# Patient Record
Sex: Male | Born: 1985 | Race: Black or African American | Hispanic: No | Marital: Single | Smoking: Never smoker
Health system: Southern US, Community
[De-identification: ages and names within clinical notes are randomized; demographics above are authoritative.]

## PROBLEM LIST (undated history)

## (undated) DIAGNOSIS — M419 Scoliosis, unspecified: Secondary | ICD-10-CM

## (undated) HISTORY — PX: OTHER SURGICAL HISTORY: SHX169

## (undated) HISTORY — DX: Scoliosis, unspecified: M41.9

---

## 2012-09-12 ENCOUNTER — Ambulatory Visit (INDEPENDENT_AMBULATORY_CARE_PROVIDER_SITE_OTHER): Payer: 59 | Admitting: Emergency Medicine

## 2012-09-12 VITALS — BP 112/78 | HR 60 | Temp 98.6°F | Resp 16 | Ht 69.5 in | Wt 180.8 lb

## 2012-09-12 DIAGNOSIS — J029 Acute pharyngitis, unspecified: Secondary | ICD-10-CM

## 2012-09-12 DIAGNOSIS — L918 Other hypertrophic disorders of the skin: Secondary | ICD-10-CM

## 2012-09-12 DIAGNOSIS — L909 Atrophic disorder of skin, unspecified: Secondary | ICD-10-CM

## 2012-09-12 MED ORDER — AZITHROMYCIN 250 MG PO TABS: ORAL_TABLET | ORAL | Status: DC

## 2012-09-12 NOTE — Progress Notes (Signed)
Procedure Note: Verbal consent obtained from the patient.  Local anesthesia with 0.5cc 1% lidocaine with epinephrine.  Skin tag removed without difficulty.  Hemostasis obtained with silver nitrate and direct pressure.  Dressing applied.  Specimen sent for pathology.  Pt tolerated the procedure well.

## 2012-09-12 NOTE — Progress Notes (Signed)
[facility] [address] [place] [address] [phone]  Date:  09/12/2012   Name:  Cameron Ramirez   DOB:  [birth year]   MRN:  [id number]  PCP:  No primary provider on file.    Chief Complaint: Nevus and Sore Throat   History of Present Illness:  Cameron Ramirez is a 27 y.o. very pleasant male patient who presents with the following:  Wears a chain and noticed a skin tag has changed in character and he is concerned as it has turned black in a portion.    Has a sore throat.  No fever or chills, nasal congestion or drainage.  Exposed to strep in a friend  There is no problem list on file for this patient.   Past Medical History  Diagnosis Date  . Scoliosis     History reviewed. No pertinent past surgical history.  History  Substance Use Topics  . Smoking status: Never Smoker   . Smokeless tobacco: Not on file  . Alcohol Use: Not on file    No family history on file.  No Known Allergies  Medication list has been reviewed and updated.  No current outpatient prescriptions on file prior to visit.    Review of Systems:  As per HPI, otherwise negative.    Physical Examination: Filed Vitals:   09/12/12 1613  BP: 112/78  Pulse: 60  Temp: 98.6 F (37 C)  Resp: 16   Filed Vitals:   09/12/12 1613  Height: 5' 9.5" (1.765 m)  Weight: 180 lb 12.8 oz (82.01 kg)   Body mass index is 26.32 kg/(m^2). Ideal Body Weight: Weight in (lb) to have BMI = 25: 171.4   GEN: WDWN, NAD, Non-toxic, A & O x 3 HEENT: Atraumatic, Normocephalic. Neck supple. No masses, No LAD. Ears and Nose: No external deformity. CV: RRR, No M/G/R. No JVD. No thrill. No extra heart sounds. PULM: CTA B, no wheezes, crackles, rhonchi. No retractions. No resp. distress. No accessory muscle use. ABD: S, NT, ND, +BS. No rebound. No HSM. EXTR: No c/c/e NEURO Normal gait.  PSYCH: Normally interactive. Conversant. Not depressed or anxious appearing.  Calm demeanor.  SKIN: left  neck has a 1.5 mm diameter skin tag.    Assessment and Plan: Strep exposure Zpak  Skin tag Amputation and send for path  Carmelina Dane, MD

## 2012-09-15 NOTE — Progress Notes (Signed)
Reviewed and agree.

## 2013-12-21 ENCOUNTER — Ambulatory Visit (INDEPENDENT_AMBULATORY_CARE_PROVIDER_SITE_OTHER): Payer: 59 | Admitting: Family Medicine

## 2013-12-21 VITALS — BP 118/72 | HR 68 | Temp 98.2°F | Resp 18 | Ht 69.0 in | Wt 186.0 lb

## 2013-12-21 DIAGNOSIS — Z113 Encounter for screening for infections with a predominantly sexual mode of transmission: Secondary | ICD-10-CM

## 2013-12-21 DIAGNOSIS — J029 Acute pharyngitis, unspecified: Secondary | ICD-10-CM

## 2013-12-21 DIAGNOSIS — Z13220 Encounter for screening for lipoid disorders: Secondary | ICD-10-CM

## 2013-12-21 DIAGNOSIS — J209 Acute bronchitis, unspecified: Secondary | ICD-10-CM

## 2013-12-21 DIAGNOSIS — Z0000 Encounter for general adult medical examination without abnormal findings: Secondary | ICD-10-CM

## 2013-12-21 LAB — POCT RAPID STREP A (OFFICE): RAPID STREP A SCREEN: POSITIVE — AB

## 2013-12-21 MED ORDER — CEFDINIR 300 MG PO CAPS: 300.0000 mg | ORAL_CAPSULE | Freq: Two times a day (BID) | ORAL | Status: DC

## 2013-12-21 NOTE — Progress Notes (Signed)
[facility] [address], [place] [id number] [phone]  Date:  12/21/2013   Name:  Cameron Ramirez   DOB:  1986/08/26   MRN:  [id number]  PCP:  No primary provider on file.    Chief Complaint: Annual Exam and Cough   History of Present Illness:  Cameron Ramirez is a 28 y.o. very pleasant male patient who presents with the following:  Here today for a CPE and also illness.  Generally healthy, non-smoker, non- drinker.  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   He does exercise- he is a Forensic psychologistprofessional dancer as well.   He is fasting today.   He would like to do STI testing as well as regular labs.   [place] and thinks he had a tetanus shot then.    He was exposed to a friend who had bronchitis a couple of weeks ago.  He notes a mild cough which can be productive and some chest congestion.  His sinuses feel "funky" and he has noted a mild ST for a couple of days.   He had a fever a couple of days ago but this has resolved.   He has been ill for a week or a little less.  No GI symptoms.   He has not urinated in over an hour.    There are no active problems to display for this patient.   Past Medical History  Diagnosis Date  . Scoliosis   . Scoliosis     History reviewed. No pertinent past surgical history.  History  Substance Use Topics  . Smoking status: Never Smoker   . Smokeless tobacco: Not on file  . Alcohol Use: No    History reviewed. No pertinent family history.  No Known Allergies  Medication list has been reviewed and updated.  Current Outpatient Prescriptions on File Prior to Visit  Medication Sig Dispense Refill  . azithromycin (ZITHROMAX) 250 MG tablet Take 2 tabs PO x 1 dose, then 1 tab PO QD x 4 days  6 tablet  0   No current facility-administered medications on file prior to visit.    Review of Systems:  As per HPI- otherwise negative.   Physical Examination: Filed Vitals:   12/21/13 1319  BP:  118/72  Pulse: 68  Temp: 98.2 F (36.8 C)  Resp: 18   Filed Vitals:   12/21/13 1319  Height: 5\' 9"  (1.753 m)  Weight: 186 lb (84.369 kg)   Body mass index is 27.45 kg/(m^2). Ideal Body Weight: Weight in (lb) to have BMI = 25: 168.9  GEN: WDWN, NAD, Non-toxic, A & O x 3, looks well, fit build HEENT: Atraumatic, Normocephalic. Neck supple. No masses, No LAD.  Bilateral TM wnl, oropharynx injected but no exudate.  PEERL,EOMI.   Ears and Nose: No external deformity. CV: RRR, No M/G/R. No JVD. No thrill. No extra heart sounds. PULM: CTA B, no wheezes, crackles, rhonchi. No retractions. No resp. distress. No accessory muscle use. ABD: S, NT, ND, +BS. No rebound. No HSM. EXTR: No c/c/e NEURO Normal gait.  PSYCH: Normally interactive. Conversant. Not depressed or anxious appearing.  Calm demeanor.  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  OW normal   Results for orders placed in visit on 12/21/13  POCT RAPID STREP A (OFFICE)      Result Value Ref Range   Rapid Strep A Screen Positive (*) Negative   Assessment and Plan:  Physical exam - Plan: CBC, Comprehensive metabolic panel  Acute pharyngitis - Plan: POCT rapid strep A, cefdinir (OMNICEF) 300 MG capsule  Routine screening for STI (sexually transmitted infection) - Plan: Hepatitis B surface antibody, Hepatitis C antibody, GC/Chlamydia Probe Amp, HIV antibody, HSV(herpes simplex vrs) 1+2 ab-IgG, Hepatitis B surface antigen, CANCELED: Hepatitis B surface antigen  Screening for hyperlipidemia - Plan: Lipid panel  Acute bronchitis - Plan: cefdinir (OMNICEF) 300 MG capsule  Treat for strep and likely bronchitis with omnicef.  Otherwise healthy young man, labs pending as above.   Will plan further follow- up pending labs.   Signed Abbe AmsterdamJessica Jonas Goh, MD

## 2013-12-21 NOTE — Patient Instructions (Signed)
We will treat your for strep throat and for bronchitis.   I will be in touch with your labs- it may take a little longer as we are using [facility] but let me know if you do not hear in about 2 weeks.   You should keep to yourself until you have been on treatment for your strep for 24 hours.  Let me know if you do not feel better soon!

## 2013-12-22 LAB — CBC
HCT: 46.9 % (ref 37.5–51.0)
HEMOGLOBIN: 16.3 g/dL (ref 12.6–17.7)
MCH: 30.5 pg (ref 26.6–33.0)
MCHC: 34.8 g/dL (ref 31.5–35.7)
MCV: 88 fL (ref 79–97)
Platelets: 247 10*3/uL (ref 150–379)
RBC: 5.34 x10E6/uL (ref 4.14–5.80)
RDW: 12 % — ABNORMAL LOW (ref 12.3–15.4)
WBC: 6.2 10*3/uL (ref 3.4–10.8)

## 2013-12-22 LAB — LIPID PANEL
CHOLESTEROL TOTAL: 148 mg/dL (ref 100–199)
Chol/HDL Ratio: 3 ratio units (ref 0.0–5.0)
HDL: 50 mg/dL (ref >39)
LDL CALC: 87 mg/dL (ref 0–99)
TRIGLYCERIDES: 55 mg/dL (ref 0–149)
VLDL CHOLESTEROL CAL: 11 mg/dL (ref 5–40)

## 2013-12-22 LAB — HIV ANTIBODY (ROUTINE TESTING W REFLEX): HIV 1/HIV 2 AB: NONREACTIVE

## 2013-12-22 LAB — HEPATITIS B SURFACE ANTIGEN: Hepatitis B Surface Ag: NEGATIVE

## 2013-12-22 LAB — COMPREHENSIVE METABOLIC PANEL
ALBUMIN: 4.6 g/dL (ref 3.5–5.5)
ALT: 12 IU/L (ref 0–44)
AST: 17 IU/L (ref 0–40)
Albumin/Globulin Ratio: 1.6 (ref 1.1–2.5)
Alkaline Phosphatase: 75 IU/L (ref 39–117)
BUN/Creatinine Ratio: 7 — ABNORMAL LOW (ref 8–19)
BUN: 7 mg/dL (ref 6–20)
CALCIUM: 9.7 mg/dL (ref 8.7–10.2)
CHLORIDE: 99 mmol/L (ref 97–108)
CO2: 29 mmol/L (ref 18–29)
Creatinine, Ser: 1 mg/dL (ref 0.76–1.27)
GFR calc non Af Amer: 103 mL/min/{1.73_m2} (ref >59)
GFR, EST AFRICAN AMERICAN: 119 mL/min/{1.73_m2} (ref >59)
GLUCOSE: 74 mg/dL (ref 65–99)
Globulin, Total: 2.9 g/dL (ref 1.5–4.5)
Potassium: 5 mmol/L (ref 3.5–5.2)
Sodium: 140 mmol/L (ref 134–144)
TOTAL PROTEIN: 7.5 g/dL (ref 6.0–8.5)
Total Bilirubin: 1.4 mg/dL — ABNORMAL HIGH (ref 0.0–1.2)

## 2013-12-22 LAB — HSV(HERPES SIMPLEX VRS) I + II AB-IGG
HAV 1 IGG,TYPE SPECIFIC AB: 47.6 index — ABNORMAL HIGH (ref 0.00–0.90)
HSV 2 IGG,TYPE SPECIFIC AB: <0.91 index (ref 0.00–0.90)

## 2013-12-22 LAB — HEPATITIS C ANTIBODY: Hep C Virus Ab: <0.1 s/co ratio (ref 0.0–0.9)

## 2013-12-22 LAB — HEPATITIS B SURFACE ANTIBODY, QUANTITATIVE: Hepatitis B Surf Ab Quant: 561.8 m[IU]/mL (ref >9.9)

## 2013-12-23 ENCOUNTER — Encounter: Payer: Self-pay | Admitting: Family Medicine

## 2013-12-25 ENCOUNTER — Encounter: Payer: Self-pay | Admitting: Family Medicine

## 2013-12-25 LAB — GC/CHLAMYDIA PROBE AMP
Chlamydia trachomatis, NAA: NEGATIVE
Neisseria gonorrhoeae by PCR: NEGATIVE

## 2014-01-04 ENCOUNTER — Encounter: Payer: Self-pay | Admitting: Family Medicine

## 2014-01-04 LAB — RPR: RPR: NONREACTIVE

## 2014-01-04 LAB — SPECIMEN STATUS REPORT

## 2014-01-22 ENCOUNTER — Ambulatory Visit (INDEPENDENT_AMBULATORY_CARE_PROVIDER_SITE_OTHER): Payer: 59 | Admitting: Internal Medicine

## 2014-01-22 VITALS — BP 126/72 | HR 83 | Temp 98.3°F | Resp 18 | Ht 69.5 in | Wt 183.0 lb

## 2014-01-22 DIAGNOSIS — R945 Abnormal results of liver function studies: Secondary | ICD-10-CM

## 2014-01-22 DIAGNOSIS — R7989 Other specified abnormal findings of blood chemistry: Secondary | ICD-10-CM

## 2014-01-22 DIAGNOSIS — Z209 Contact with and (suspected) exposure to unspecified communicable disease: Secondary | ICD-10-CM

## 2014-01-22 NOTE — Progress Notes (Signed)
   Subjective:  This chart was scribed for Dr. Ellamae Sia, MD by Leone Payor, ED Scribe. This patient was seen in room 10 and the patient's care was started 11:30 AM.    Patient ID: Cameron Ramirez, male    DOB: April 29, 1986, 28 y.o.   MRN: [id number]  HPI HPI Comments: Cameron Ramirez is a 28 y.o. male who presents to [facility] for a follow up after his visit with Dr. Dallas Schimke on 12/21/13. He was advised to follow up for elevated bilirubin. He also requests re-screening for HIV and syphilis today.  He needs a note for his work proving hepatitis B Immunity. He also has questions about abdominal cramping--has been dehydrated this week/also constipated Review of Systems No fever or chills In addition urea or discharge    Objective:   Physical Exam  Nursing note and vitals reviewed. Constitutional: He is oriented to person, place, and time. He appears well-developed and well-nourished.  HENT:  Head: Normocephalic and atraumatic.  Cardiovascular: Normal rate.   Pulmonary/Chest: Effort normal and breath sounds normal. No respiratory distress.  Abdominal: Soft. Bowel sounds are normal. He exhibits no distension and no mass. There is no tenderness. There is no rebound and no guarding.  Neurological: He is alert and oriented to person, place, and time.  Skin: Skin is warm and dry.  Psychiatric: He has a normal mood and affect.       Assessment & Plan:   I have completed the patient encounter in its entirety as documented by the scribe, with editing by me where necessary. Robert P. Merla Riches, M.D. Abnormal LFTs - Plan: RPR, HIV antibody, Hepatic function panel-sent to [employer] where he is employed  Exposure to communicable disease - Plan: RPR, HIV antibody, Hepatic function panel, CANCELED: RPR, CANCELED: HIV antibody   Notify lab results Abdominal discomfort secondary to constipation-use MiraLax and continue fluids with a high fiber diet

## 2014-01-24 LAB — HEPATIC FUNCTION PANEL
ALT: 90 IU/L — ABNORMAL HIGH (ref 0–44)
AST: 71 IU/L — AB (ref 0–40)
Albumin: 4.3 g/dL (ref 3.5–5.5)
Alkaline Phosphatase: 89 IU/L (ref 39–117)
Bilirubin, Direct: 0.36 mg/dL (ref 0.00–0.40)
TOTAL PROTEIN: 7.2 g/dL (ref 6.0–8.5)
Total Bilirubin: 1.4 mg/dL — ABNORMAL HIGH (ref 0.0–1.2)

## 2014-01-24 LAB — RPR: SYPHILIS RPR SCR: NONREACTIVE

## 2014-01-24 LAB — HIV ANTIBODY (ROUTINE TESTING W REFLEX): HIV-1/HIV-2 Ab: NONREACTIVE

## 2014-02-10 ENCOUNTER — Ambulatory Visit (INDEPENDENT_AMBULATORY_CARE_PROVIDER_SITE_OTHER): Payer: 59 | Admitting: Internal Medicine

## 2014-02-10 VITALS — BP 120/72 | HR 60 | Temp 98.1°F | Resp 16 | Ht 69.5 in | Wt 179.6 lb

## 2014-02-10 DIAGNOSIS — R945 Abnormal results of liver function studies: Secondary | ICD-10-CM

## 2014-02-10 LAB — POCT CBC
Granulocyte percent: 36.7 %G — AB (ref 37–80)
HEMATOCRIT: 46.2 % (ref 43.5–53.7)
Hemoglobin: 15.6 g/dL (ref 14.1–18.1)
LYMPH, POC: 3.2 (ref 0.6–3.4)
MCH: 30.7 pg (ref 27–31.2)
MCHC: 33.8 g/dL (ref 31.8–35.4)
MCV: 90.9 fL (ref 80–97)
MID (cbc): 0.6 (ref 0–0.9)
MPV: 9.5 fL (ref 0–99.8)
POC Granulocyte: 2.2 (ref 2–6.9)
POC LYMPH %: 53.4 % — AB (ref 10–50)
POC MID %: 9.9 %M (ref 0–12)
Platelet Count, POC: 246 10*3/uL (ref 142–424)
RBC: 5.08 M/uL (ref 4.69–6.13)
RDW, POC: 12.6 %
WBC: 5.9 10*3/uL (ref 4.6–10.2)

## 2014-02-10 LAB — POCT SEDIMENTATION RATE: POCT SED RATE: 9 mm/hr (ref 0–22)

## 2014-02-10 NOTE — Progress Notes (Signed)
Subjective:    Patient ID: Cameron Ramirez, male    DOB: Oct 08, 1985, 28 y.o.   MRN: [id number]  This chart was scribed for Ellamae Siaobert Ridley Schewe, MD by Jarvis Morganaylor Ferguson, Medical Scribe. This patient was seen in Room 1 and the patient's care was started at 11:36 AM.    Chief Complaint  Patient presents with  . Follow-up    regarding elevated Liver enzymes    HPI HPI Comments: Cameron Ramirez is a 28 y.o. male who [facility] follow up from his appt on 5//25/15 where he had elevated liver enzymes. Patient states that he has been feeling great and would not know of any problem if not for the blood tests. Patient states that is having some mild heartburn noted after eating certain foods. He states this comes mostly from fried foods and certain sauces. 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 He states that his mother has a history of elevated triglyceride levels but he states she does not have hyperlipidemia. He also states that both his grandmothers suffered from strokes and MIs.  Patient denies any weight loss, diaphoresis, fevers, or digestive issues.  see recent labs There are no active problems to display for this patient.    Review of Systems  Constitutional: Negative for fever, diaphoresis, appetite change, fatigue and unexpected weight change.  Gastrointestinal: Negative for vomiting, abdominal pain and diarrhea.       Positive for mild heartburn after eating certain foods  Genitourinary: Negative for difficulty urinating.  All other systems reviewed and are negative.      Objective:   Physical Exam  Nursing note and vitals reviewed. Constitutional: He is oriented to person, place, and time. He appears well-developed and well-nourished. No distress.  HENT:  Head: Normocephalic and atraumatic.  Eyes: Conjunctivae and EOM are normal.  Neck: Normal range of motion. No tracheal  deviation present.  Cardiovascular: Normal rate.   Pulmonary/Chest: Effort normal. No respiratory distress.  Abdominal: Soft. He exhibits no mass. There is no tenderness.  Liver is not enlarged or tender. No abdominal masses  Musculoskeletal: Normal range of motion.  Neurological: He is alert and oriented to person, place, and time.  Skin: Skin is warm and dry.  Psychiatric: He has a normal mood and affect. His behavior is normal.        Assessment & Plan:  Abnormal results of liver function studies - Plan: POCT CBC, POCT SEDIMENTATION RATE, Hepatic Function Panel,   Results for orders placed in visit on 02/10/14  POCT CBC      Result Value Ref Range   WBC 5.9  4.6 - 10.2 K/uL   Lymph, poc 3.2  0.6 - 3.4   POC LYMPH PERCENT 53.4 (*) 10 - 50 %L   MID (cbc) 0.6  0 - 0.9   POC MID % 9.9  0 - 12 %M   POC Granulocyte 2.2  2 - 6.9   Granulocyte percent 36.7 (*) 37 - 80 %G   RBC 5.08  4.69 - 6.13 M/uL   Hemoglobin 15.6  14.1 - 18.1 g/dL   HCT, POC 82.946.2  56.243.5 - 53.7 %   MCV 90.9  80 - 97 fL   MCH, POC 30.7  27 - 31.2 pg   MCHC 33.8  31.8 - 35.4 g/dL   RDW, POC 13.012.6     Platelet Count, POC 246  142 -  424 K/uL   MPV 9.5  0 - 99.8 fL  POCT SEDIMENTATION RATE      Result Value Ref Range   POCT SED RATE 9  0 - 22 mm/hr   Awaiting repeat liver studies/may need to add EBV serology

## 2014-02-11 DIAGNOSIS — R945 Abnormal results of liver function studies: Secondary | ICD-10-CM | POA: Insufficient documentation

## 2014-02-13 LAB — HEPATIC FUNCTION PANEL
ALK PHOS: 89 IU/L (ref 39–117)
ALT: 341 IU/L — ABNORMAL HIGH (ref 0–44)
AST: 117 IU/L — ABNORMAL HIGH (ref 0–40)
Albumin: 4.2 g/dL (ref 3.5–5.5)
BILIRUBIN DIRECT: 0.33 mg/dL (ref 0.00–0.40)
TOTAL PROTEIN: 7.1 g/dL (ref 6.0–8.5)
Total Bilirubin: 1.1 mg/dL (ref 0.0–1.2)

## 2014-02-14 NOTE — Addendum Note (Signed)
Addended by: Johnnette LitterARDWELL, ERIN M on: 02/14/2014 07:41 PM   Modules accepted: Orders

## 2014-02-16 LAB — IRON AND TIBC
Iron Saturation: 34 % (ref 15–55)
Iron: 100 ug/dL (ref 40–155)
TIBC: 292 ug/dL (ref 250–450)
UIBC: 192 ug/dL (ref 150–375)

## 2014-02-16 LAB — SPECIMEN STATUS REPORT

## 2014-02-27 ENCOUNTER — Ambulatory Visit
Admission: RE | Admit: 2014-02-27 | Discharge: 2014-02-27 | Disposition: A | Discharge status: Home / Self Care | Payer: 59 | Source: Ambulatory Visit | Attending: Internal Medicine | Admitting: Internal Medicine

## 2014-02-27 DIAGNOSIS — R945 Abnormal results of liver function studies: Secondary | ICD-10-CM

## 2014-03-03 ENCOUNTER — Ambulatory Visit (INDEPENDENT_AMBULATORY_CARE_PROVIDER_SITE_OTHER): Payer: 59 | Admitting: Internal Medicine

## 2014-03-03 VITALS — BP 130/60 | HR 55 | Temp 98.1°F | Resp 16 | Ht 69.5 in | Wt 183.0 lb

## 2014-03-03 DIAGNOSIS — R945 Abnormal results of liver function studies: Secondary | ICD-10-CM

## 2014-03-03 DIAGNOSIS — R7989 Other specified abnormal findings of blood chemistry: Secondary | ICD-10-CM

## 2014-03-03 NOTE — Progress Notes (Signed)
   Subjective:  This chart was scribed for [name], MD by Elveria Risingimelie Horne, Medial Scribe. This patient was seen in room 12 and the patient's care was started at 9:25 AM.    Patient ID: Cameron Ramirez, male    DOB: 01-22-1986, 28 y.o.   MRN: [id number]  HPI HPI Comments: Cameron Dondre Augenstein is a 28 y.o. male who [facility] follow up evaluation after abnormal liver enzymes found at appointment on 01/22/14. Patient endorses that he feels healthy and denies any medical complaints.   See recent labs US wnl Works -in micro at [employer] No fam hx known for heochr and fe/tibc <45/ferritin wnl  Patient Active Problem List   Diagnosis Date Noted  . Abnormal results of liver function studies 02/11/2014   Past Medical History  Diagnosis Date  . Scoliosis   . Scoliosis    Past Surgical History  Procedure Laterality Date  . Orchiectomy Right     as a teen, due to a torsion   No Known Allergies Prior to Admission medications   Not on File   History   Social History  . Marital Status: Single    Spouse Name: N/A    Number of Children: N/A  . Years of Education: N/A   Occupational History  . Not on file.   Social History Main Topics  . Smoking status: Never Smoker   . Smokeless tobacco: Not on file  . Alcohol Use: No  . Drug Use: No  . Sexual Activity: Not on file   Other Topics Concern  . Not on file   Social History Narrative  . No narrative on file      Review of Systems  Constitutional: Negative for fever and fatigue.  no jaundice No liver toxic substances or meds     Objective:   Physical Exam  Nursing note and vitals reviewed. Constitutional: He is oriented to person, place, and time. He appears well-developed and well-nourished. No distress.  HENT:  Head: Normocephalic and atraumatic.  Eyes: EOM are normal.  Neck: Neck supple.  Cardiovascular: Normal rate.   Pulmonary/Chest: Effort normal. No respiratory distress.    Abdominal: Soft. He exhibits no mass. There is no tenderness. There is no rebound and no guarding.  No hepatomegaly or tenderness.   Musculoskeletal: Normal range of motion.  Neurological: He is alert and oriented to person, place, and time.  Skin: Skin is warm and dry.  Psychiatric: He has a normal mood and affect. His behavior is normal.    Filed Vitals:   03/03/14 0846  BP: 130/60  Pulse: 55  Temp: 98.1 F (36.7 C)  Resp: 16       Assessment & Plan:  Abnormal LFTs - Plan:next level of labs for dx ordered///if no answer will consult GI

## 2014-03-27 ENCOUNTER — Telehealth: Payer: Self-pay | Admitting: Internal Medicine

## 2014-03-27 NOTE — Telephone Encounter (Signed)
Labs ret to wnl

## 2014-04-03 ENCOUNTER — Encounter: Payer: Self-pay | Admitting: Internal Medicine

## 2015-04-21 IMAGING — US US ABDOMEN LIMITED
1 series · 14 of 25 positions shown · non-contrast
Comparison: None.

CLINICAL DATA: Elevated liver function tests

EXAM:
US ABDOMEN LIMITED - RIGHT UPPER QUADRANT

[Series 1: us abdomen limited · 0.24mm/px · 14 of 54 slices shown]
[im 1/54]
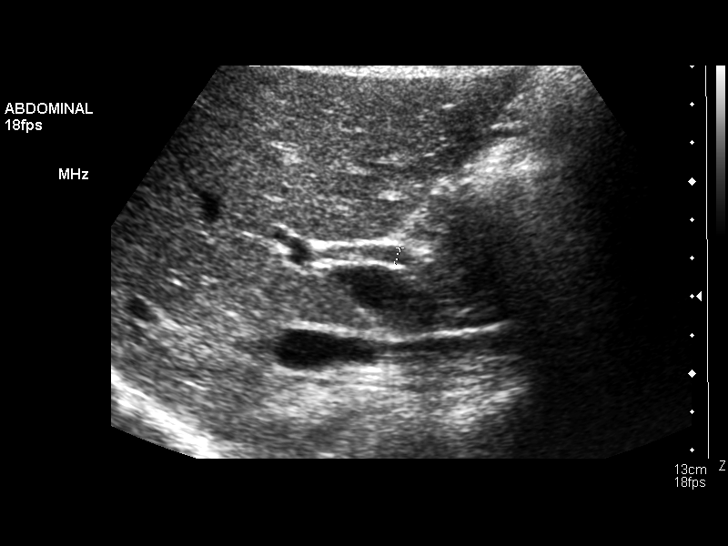
[im 5/54]
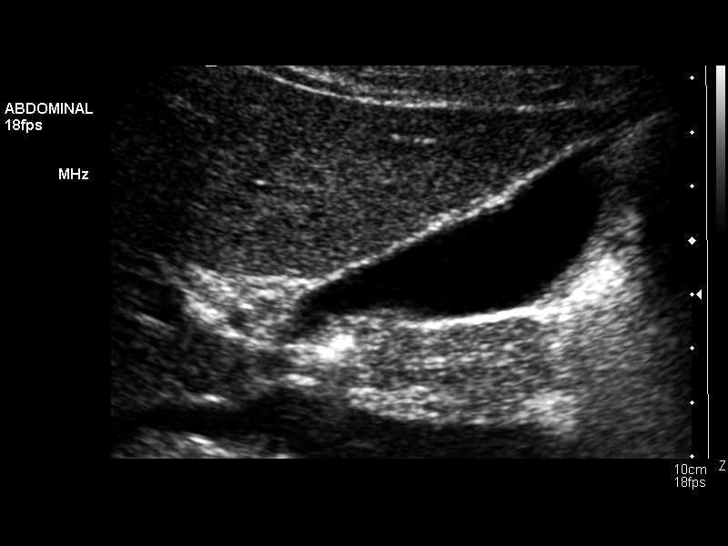
[im 9/54]
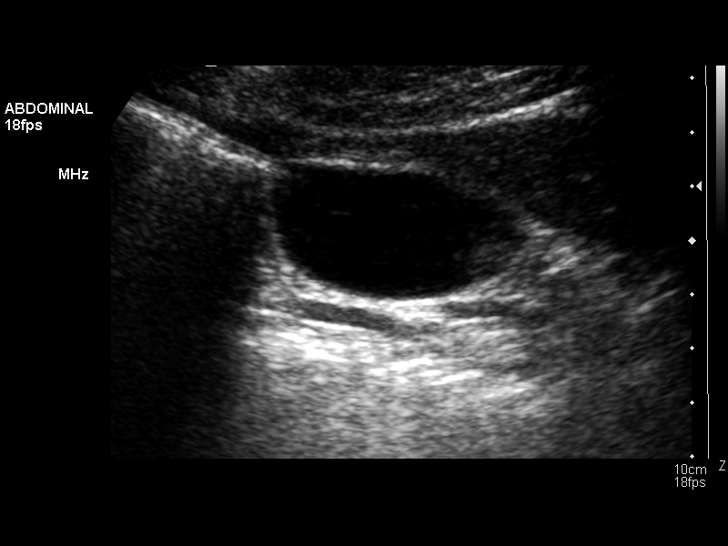
[im 14/54]
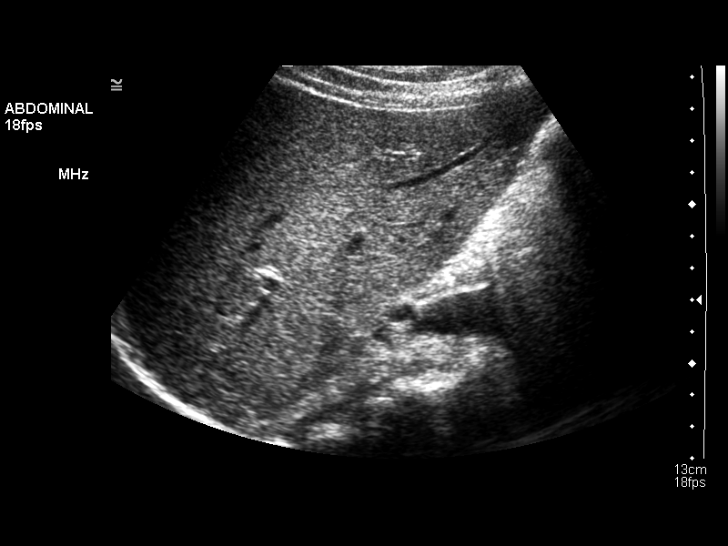
[im 18/54]
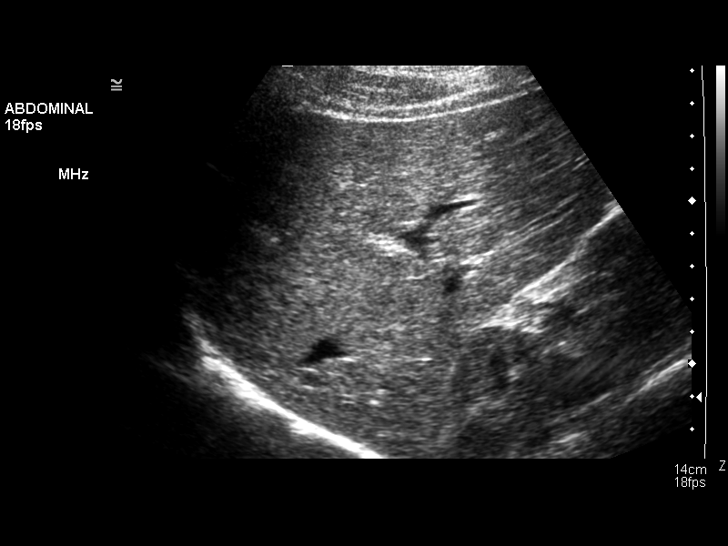
[im 20/54]
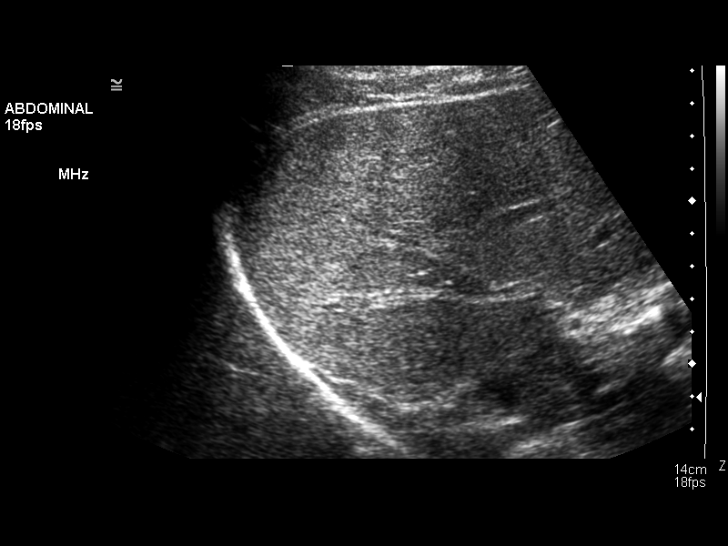
[im 25/54]
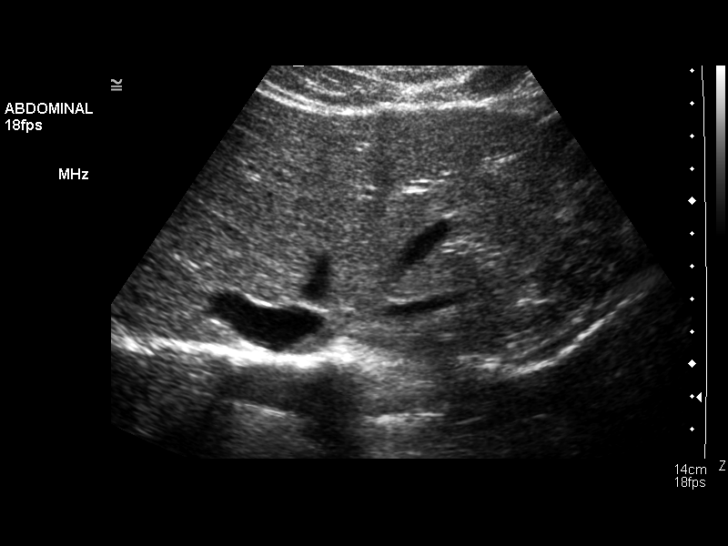
[im 29/54]
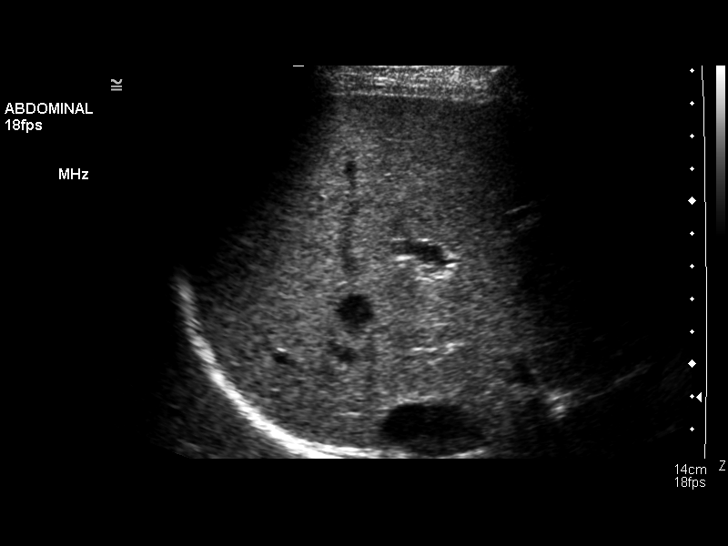
[im 34/54]
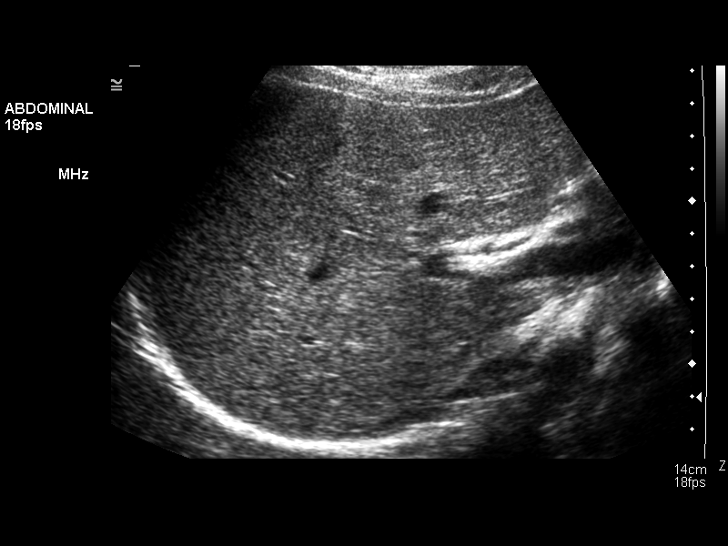
[im 36/54]
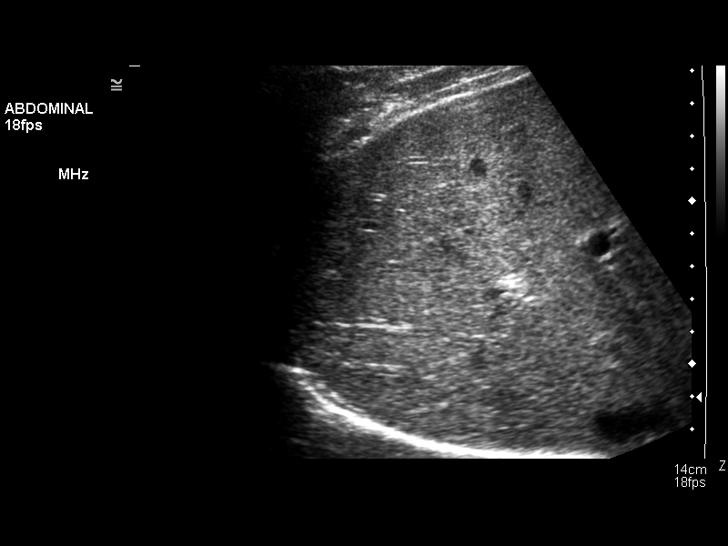
[im 40/54]
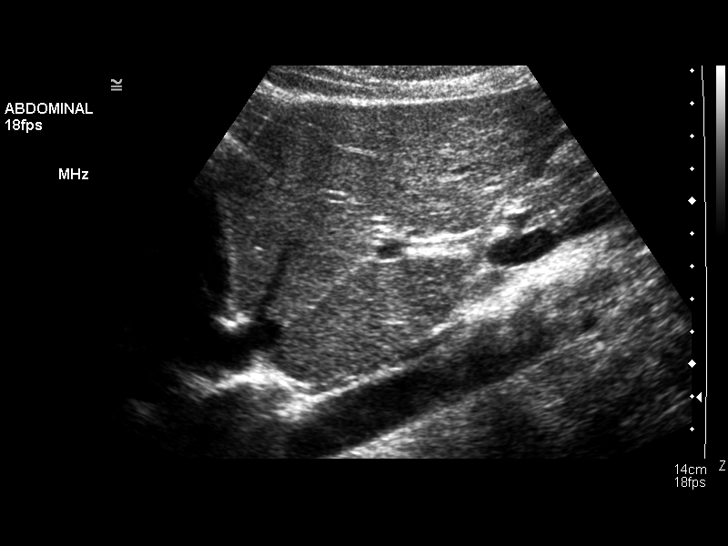
[im 45/54]
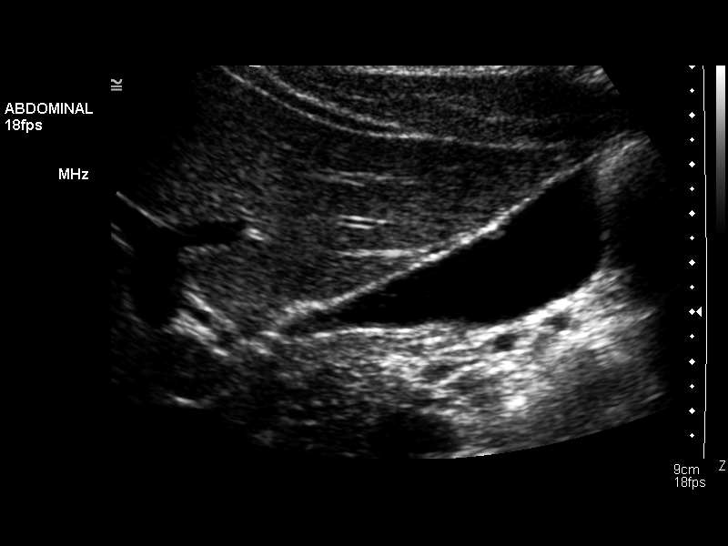
[im 49/54]
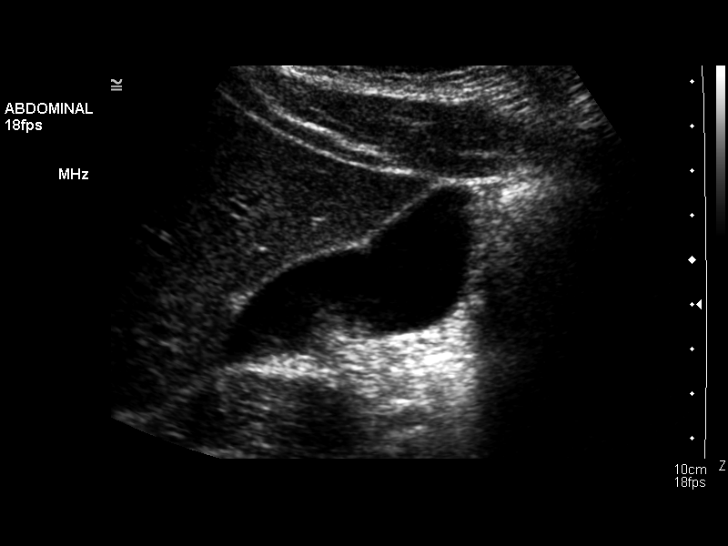
[im 54/54]
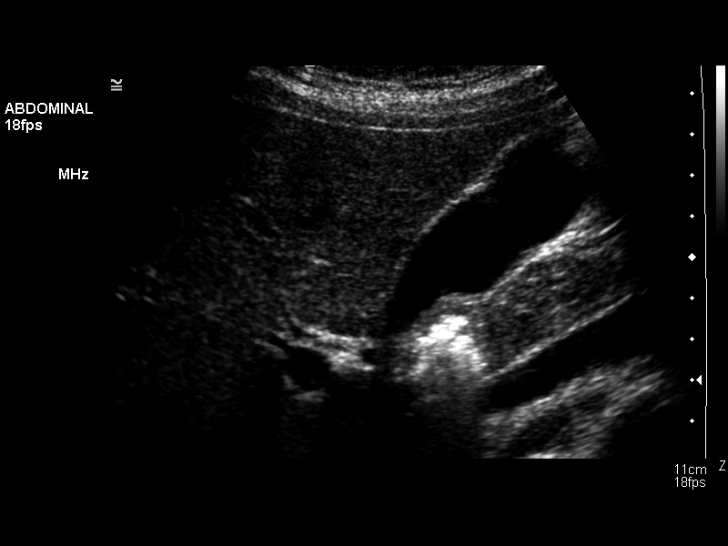

[14 of 25 positions shown; findings below may reference images not displayed]

FINDINGS: Gallbladder:

The gallbladder is visualized and no gallstones are noted. There is
no pain over the gallbladder with compression.

Common bile duct:

Diameter: The common bile duct is normal measuring 4.7 mm in
diameter.

Liver:

The liver is minimally inhomogeneous which could indicate very mild
fatty infiltration. No focal hepatic abnormality is seen.
IMPRESSION: 1. No gallstones.
2. Mildly inhomogeneous liver parenchyma may indicate fatty
infiltration. Correlate with LFTs.
